# Patient Record
Sex: Female | Born: 1965 | Race: White | Hispanic: No | Marital: Married | Smoking: Never smoker
Health system: Southern US, Community
[De-identification: ages and names within clinical notes are randomized; demographics above are authoritative.]

---

## 2006-02-12 ENCOUNTER — Ambulatory Visit: Payer: Self-pay | Admitting: Family Medicine

## 2011-02-13 ENCOUNTER — Ambulatory Visit: Payer: Self-pay | Admitting: Family Medicine

## 2013-12-09 ENCOUNTER — Ambulatory Visit: Payer: Self-pay | Admitting: Family Medicine

## 2016-07-28 ENCOUNTER — Other Ambulatory Visit: Payer: Self-pay | Admitting: Family Medicine

## 2016-07-28 DIAGNOSIS — Z1231 Encounter for screening mammogram for malignant neoplasm of breast: Secondary | ICD-10-CM

## 2016-08-07 ENCOUNTER — Ambulatory Visit
Admission: RE | Admit: 2016-08-07 | Discharge: 2016-08-07 | Disposition: A | Discharge status: Home / Self Care | Payer: 59 | Source: Ambulatory Visit | Attending: Family Medicine | Admitting: Family Medicine

## 2016-08-07 ENCOUNTER — Encounter: Payer: Self-pay | Admitting: Radiology

## 2016-08-07 DIAGNOSIS — Z1231 Encounter for screening mammogram for malignant neoplasm of breast: Secondary | ICD-10-CM | POA: Insufficient documentation

## 2019-03-30 ENCOUNTER — Other Ambulatory Visit: Payer: Self-pay | Admitting: Family Medicine

## 2019-03-30 DIAGNOSIS — Z1231 Encounter for screening mammogram for malignant neoplasm of breast: Secondary | ICD-10-CM

## 2019-06-22 ENCOUNTER — Ambulatory Visit
Admission: RE | Admit: 2019-06-22 | Discharge: 2019-06-22 | Disposition: A | Discharge status: Home / Self Care | Payer: BC Managed Care – PPO | Source: Ambulatory Visit | Attending: Family Medicine | Admitting: Family Medicine

## 2019-06-22 ENCOUNTER — Other Ambulatory Visit: Payer: Self-pay

## 2019-06-22 DIAGNOSIS — Z1231 Encounter for screening mammogram for malignant neoplasm of breast: Secondary | ICD-10-CM | POA: Diagnosis not present

## 2021-05-28 IMAGING — MG DIGITAL SCREENING BILAT W/ TOMO W/ CAD
6 of 10 series · 6 of 30 positions shown · non-contrast
Comparison: Previous exam(s).

CLINICAL DATA: Screening.

EXAM:
DIGITAL SCREENING BILATERAL MAMMOGRAM WITH TOMO AND CAD

[L CC synth-2D]
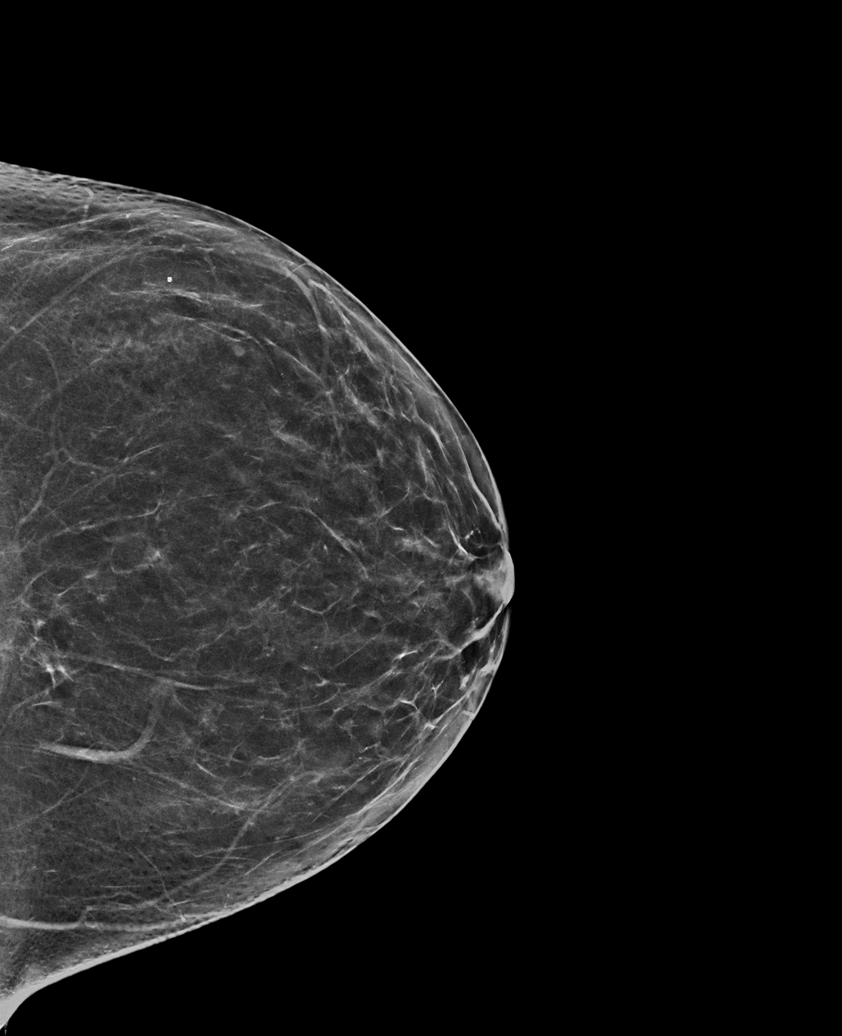

[L MLO synth-2D (1 of 2)]
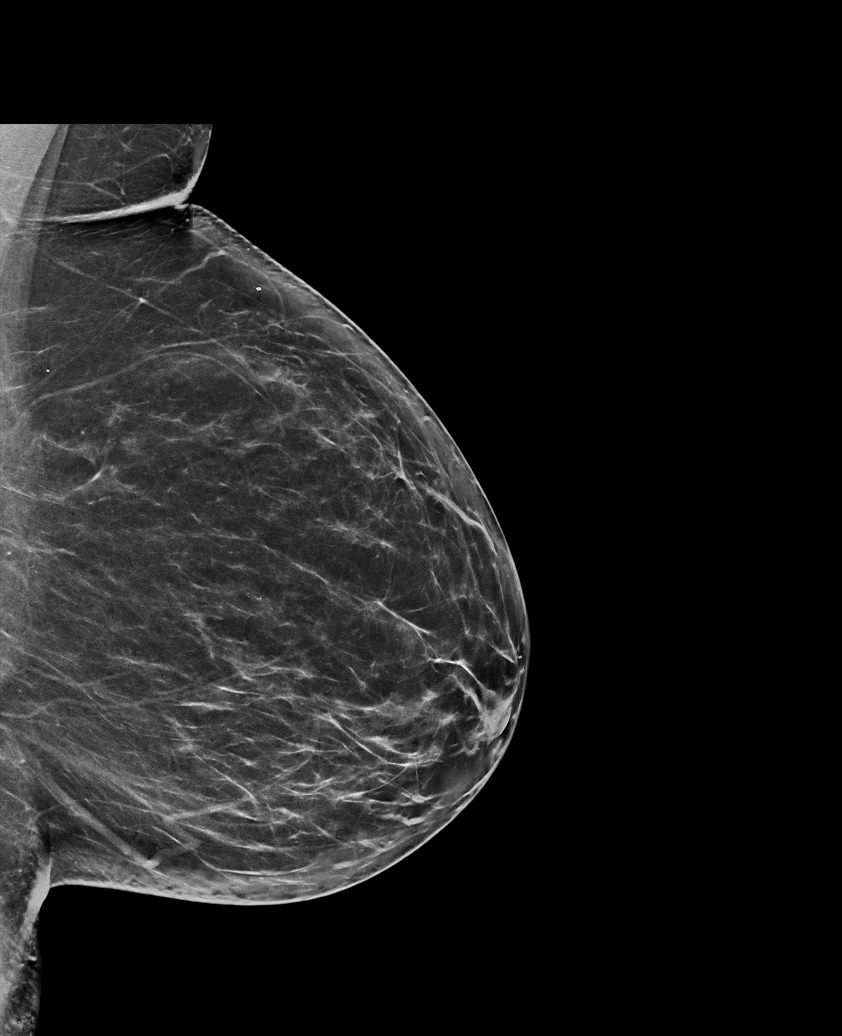

[R MLO synth-2D]
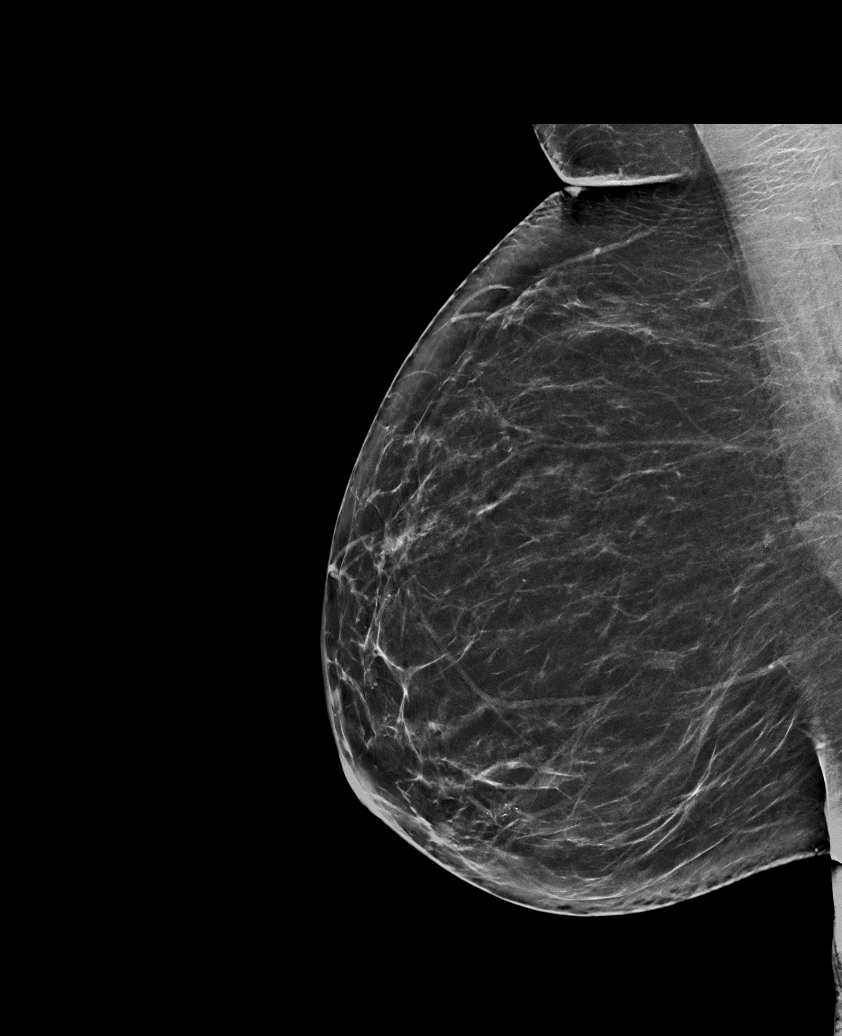

[L MLO synth-2D (2 of 2)]
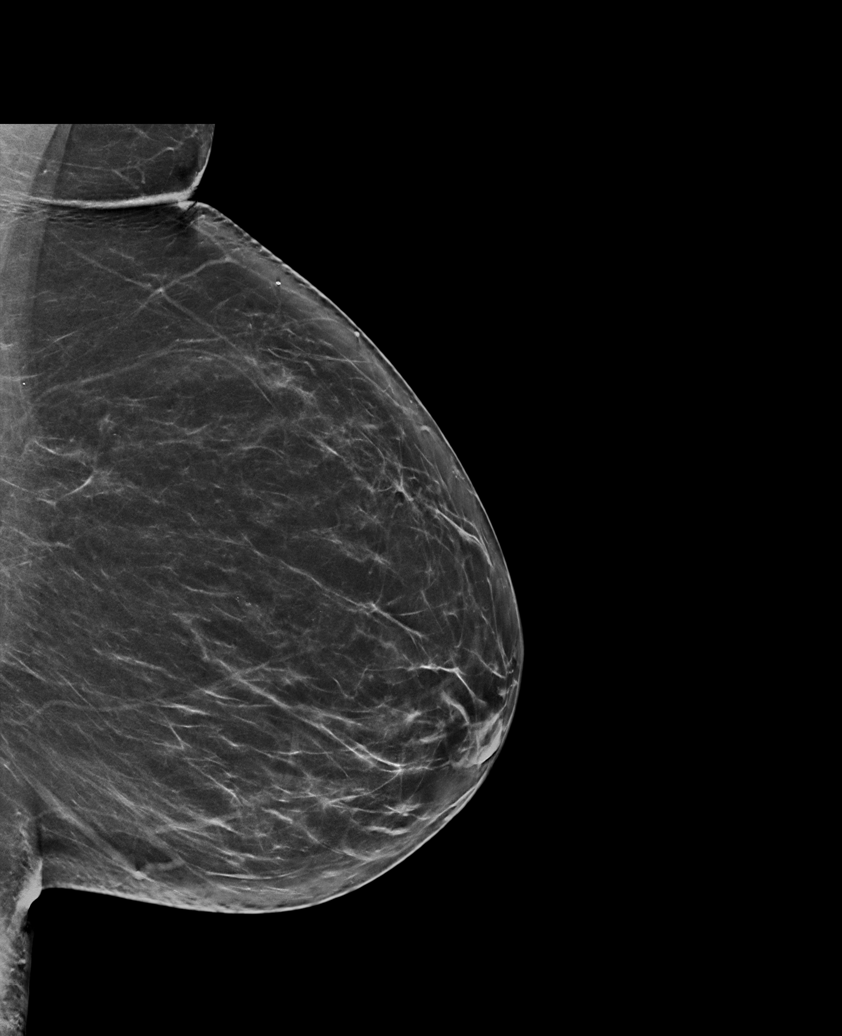

[R CC synth-2D]
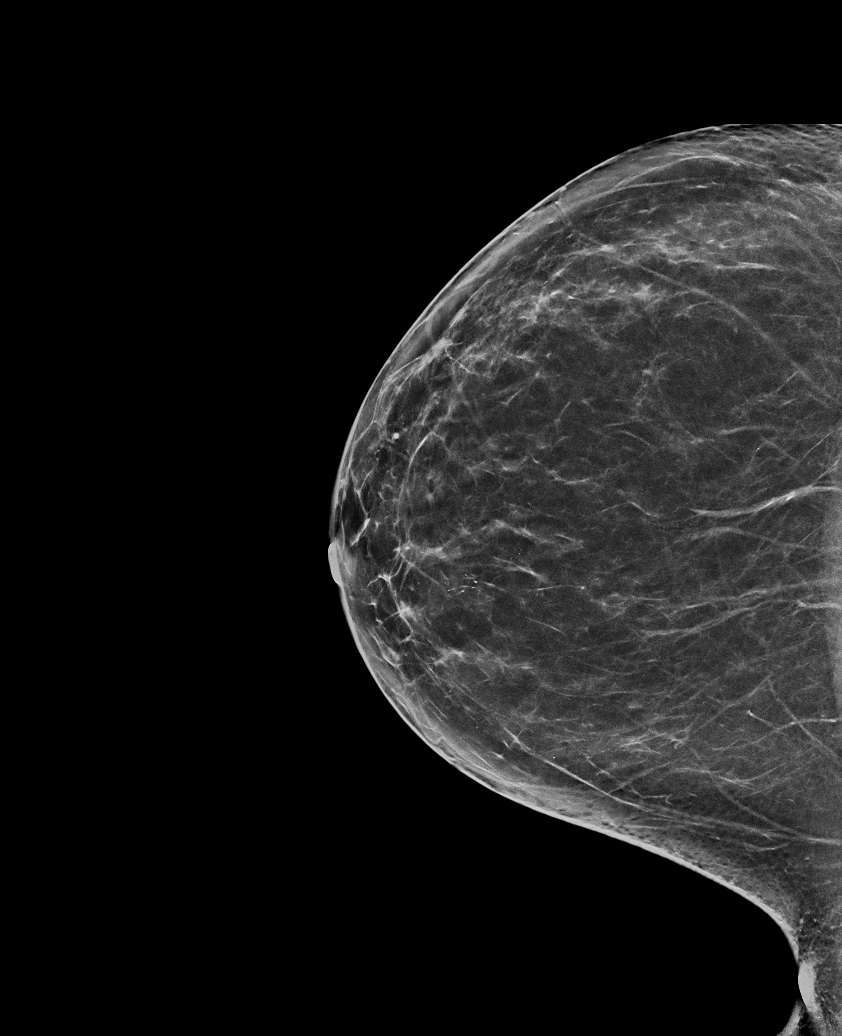

[L MLO tomo · tomo slice 38/75.0]
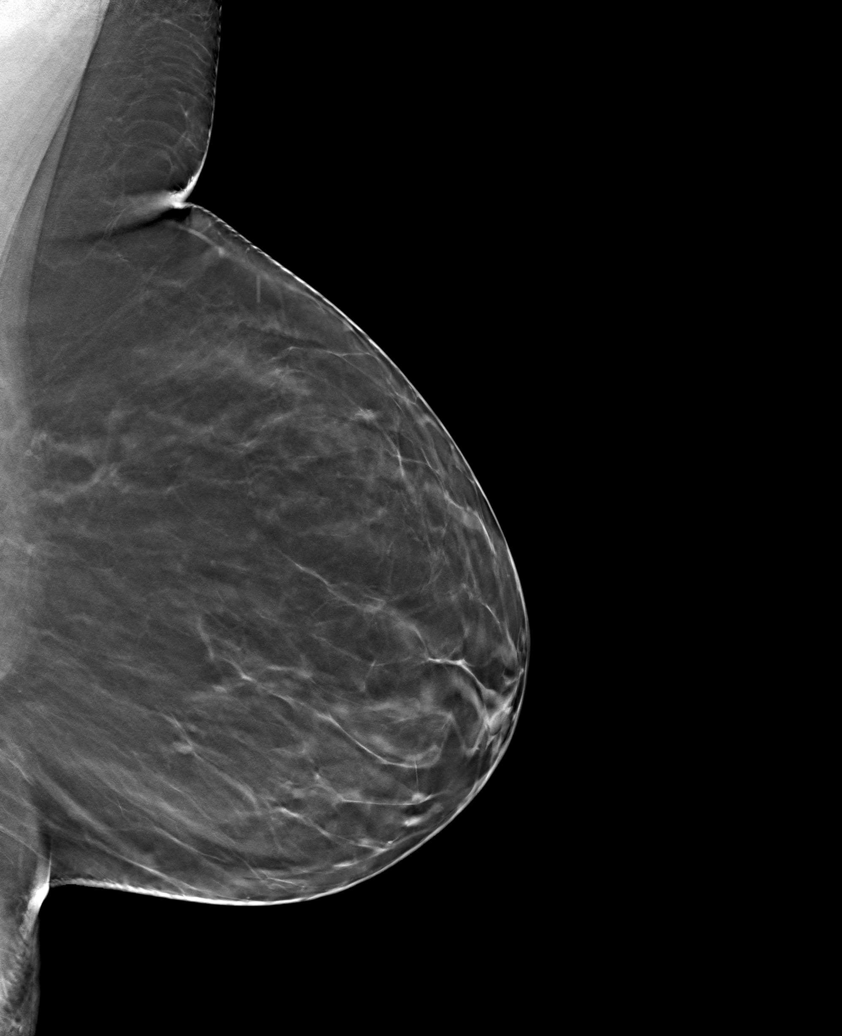

[6 of 30 positions shown; findings below may reference images not displayed]

ACR Breast Density Category b: There are scattered areas of
fibroglandular density.
FINDINGS: There are no findings suspicious for malignancy. Images were
processed with CAD.
IMPRESSION: No mammographic evidence of malignancy. A result letter of this
screening mammogram will be mailed directly to the patient.

RECOMMENDATION:
Screening mammogram in one year. (Code:[id number])

BI-RADS CATEGORY  1: Negative.

## 2021-08-15 ENCOUNTER — Other Ambulatory Visit: Payer: Self-pay | Admitting: Family Medicine

## 2021-08-29 ENCOUNTER — Ambulatory Visit
Admission: RE | Admit: 2021-08-29 | Discharge: 2021-08-29 | Disposition: A | Discharge status: Home / Self Care | Payer: BC Managed Care – PPO | Source: Ambulatory Visit | Attending: Family Medicine | Admitting: Family Medicine

## 2021-08-29 DIAGNOSIS — Z1231 Encounter for screening mammogram for malignant neoplasm of breast: Secondary | ICD-10-CM | POA: Diagnosis not present

## 2022-05-25 ENCOUNTER — Ambulatory Visit: Payer: BC Managed Care – PPO

## 2022-10-01 ENCOUNTER — Ambulatory Visit
Admission: EM | Admit: 2022-10-01 | Discharge: 2022-10-01 | Disposition: A | Discharge status: Home / Self Care | Payer: BC Managed Care – PPO | Attending: Physician Assistant | Admitting: Physician Assistant

## 2022-10-01 DIAGNOSIS — R051 Acute cough: Secondary | ICD-10-CM

## 2022-10-01 DIAGNOSIS — J029 Acute pharyngitis, unspecified: Secondary | ICD-10-CM

## 2022-10-01 DIAGNOSIS — J45901 Unspecified asthma with (acute) exacerbation: Secondary | ICD-10-CM

## 2022-10-01 LAB — GROUP A STREP BY PCR: Group A Strep by PCR: NOT DETECTED

## 2022-10-01 MED ORDER — PREDNISONE 10 MG PO TABS: ORAL_TABLET | ORAL | 0 refills | Status: AC

## 2022-10-01 NOTE — ED Provider Notes (Signed)
[facility]    CSN: [id number] Arrival date & time: 10/01/22  1135      History   Chief Complaint Chief Complaint  Patient presents with   Cough   Sore Throat    HPI Kaitlin Dunn is a 57 y.o. female presenting for approximately 2-day history of fatigue, feeling feverish with sore throat, cough, congestion, wheezing and shortness of breath.  Denies sinus pain, chest pain, vomiting or diarrhea.  Reports that she has been around a sick family member but unsure if he had COVID, flu or strep.  Has taken a COVID test at home and says it was negative.  Patient reports that she has been taking Advair daily and also reports having to use albuterol multiple times a day.  She says she is afraid that she started to have an exacerbation of her asthma and reports that she always tries to have a corticosteroid before it gets too bad.  She says that usually happens about once a year.  HPI  History reviewed. No pertinent past medical history.  There are no problems to display for this patient.   History reviewed. No pertinent surgical history.  OB History   No obstetric history on file.      Home Medications    Prior to Admission medications   Medication Sig Start Date End Date Taking? Authorizing Provider  albuterol (VENTOLIN HFA) 108 (90 Base) MCG/ACT inhaler Inhale into the lungs.   Yes [provider]  Cholecalciferol (VITAMIN D-1000 MAX ST) 25 MCG (1000 UT) tablet Take by mouth. 07/11/19  Yes [provider]  FLUoxetine (PROZAC) 20 MG capsule TAKE 1 TO 2 CAPSULES BY MOUTH EVERY DAY 02/22/18  Yes [provider]  fluticasone-salmeterol (ADVAIR) 250-50 MCG/ACT AEPB INHALE 1 INHALATION INTO THE LUNGS EVERY 12 (TWELVE) HOURS 03/21/19  Yes [provider]  losartan (COZAAR) 25 MG tablet Take 1 tablet by mouth daily. 08/11/22 08/11/23 Yes [provider]  montelukast (SINGULAIR) 10 MG tablet Take 1 tablet by mouth at bedtime. 04/04/20   Yes [provider]  predniSONE (DELTASONE) 10 MG tablet Take 6 tabs po on day 1 and decrease by 1 tab daily until complete 10/01/22  Yes Shirlee Latch, PA-C    Family History Family History  Problem Relation Age of Onset   Breast cancer Neg Hx     Social History Social History   Tobacco Use   Smoking status: Never   Smokeless tobacco: Never  Vaping Use   Vaping Use: Never used  Substance Use Topics   Alcohol use: Yes     Allergies   Sulfa antibiotics   Review of Systems Review of Systems  Constitutional:  Positive for fatigue. Negative for chills, diaphoresis and fever.  HENT:  Positive for congestion, rhinorrhea and sore throat. Negative for ear pain, sinus pressure and sinus pain.   Respiratory:  Positive for cough, chest tightness, shortness of breath and wheezing.   Cardiovascular:  Negative for chest pain.  Gastrointestinal:  Negative for abdominal pain, nausea and vomiting.  Musculoskeletal:  Negative for arthralgias and myalgias.  Skin:  Negative for rash.  Neurological:  Negative for weakness and headaches.  Hematological:  Negative for adenopathy.     Physical Exam Triage Vital Signs ED Triage Vitals  Enc Vitals Group     BP      Pulse      Resp      Temp      Temp src  SpO2      Weight      Height      Head Circumference      Peak Flow      Pain Score      Pain Loc      Pain Edu?      Excl. in GC?    No data found.  Updated Vital Signs BP (!) 159/95   Pulse 78   Temp 98.8 F (37.1 C) (Oral)   SpO2 97%      Physical Exam Vitals and nursing note reviewed.  Constitutional:      General: She is not in acute distress.    Appearance: Normal appearance. She is not ill-appearing or toxic-appearing.  HENT:     Head: Normocephalic and atraumatic.     Nose: Congestion present.     Mouth/Throat:     Mouth: Mucous membranes are moist.     Pharynx: Oropharynx is clear. Posterior oropharyngeal erythema present.  Eyes:      General: No scleral icterus.       Right eye: No discharge.        Left eye: No discharge.     Conjunctiva/sclera: Conjunctivae normal.  Cardiovascular:     Rate and Rhythm: Normal rate and regular rhythm.     Heart sounds: Normal heart sounds.  Pulmonary:     Effort: Pulmonary effort is normal. No respiratory distress.     Breath sounds: Wheezing present.  Musculoskeletal:     Cervical back: Neck supple.  Skin:    General: Skin is dry.  Neurological:     General: No focal deficit present.     Mental Status: She is alert. Mental status is at baseline.     Motor: No weakness.     Gait: Gait normal.  Psychiatric:        Mood and Affect: Mood normal.        Behavior: Behavior normal.        Thought Content: Thought content normal.      UC Treatments / Results  Labs (all labs ordered are listed, but only abnormal results are displayed) Labs Reviewed  GROUP A STREP BY PCR    EKG   Radiology No results found.  Procedures Procedures (including critical care time)  Medications Ordered in UC Medications - No data to display  Initial Impression / Assessment and Plan / UC Course  I have reviewed the triage vital signs and the nursing notes.  Pertinent labs & imaging results that were available during my care of the patient were reviewed by me and considered in my medical decision making (see chart for details).   57 year old female presents for 2-day history of fatigue, cough, congestion, sore throat, chest tightness and shortness of breath. Negative COVID test at home.  Vitals are stable but blood pressure is a bit elevated.  She is afebrile.  Overall well-appearing.  No acute distress.  Mild nasal congestion and erythema posterior pharynx.  Few scattered wheezes and overall mildly reduced breath sounds.  Patient strep test performed.  Negative.  Reviewed with the patient.  Viral illness.  Mild asthma exacerbation.  Sent prednisone for asthma exacerbation.  Advised  decongestants, rest, fluids, use of inhalers.  Reviewed return precautions.   Final Clinical Impressions(s) / UC Diagnoses   Final diagnoses:  Asthma with acute exacerbation, unspecified asthma severity, unspecified whether persistent  Acute cough  Sore throat     Discharge Instructions      URI/COLD SYMPTOMS: Your  exam today is consistent with a viral illness. Antibiotics are not indicated at this time. Use medications as directed, including cough syrup, nasal saline, and decongestants. Your symptoms should improve over the next few days and resolve within 7-10 days. Increase rest and fluids. F/u if symptoms worsen or predominate such as sore throat, ear pain, productive cough, shortness of breath, or if you develop high fevers or worsening fatigue over the next several days.    -Continue to use inhalers. Start prednisone. Start Mucinex. F/u if symptoms worsen.     ED Prescriptions     Medication Sig Dispense Auth. Provider   predniSONE (DELTASONE) 10 MG tablet Take 6 tabs po on day 1 and decrease by 1 tab daily until complete 21 tablet Shirlee Latch, PA-C      PDMP not reviewed this encounter.   Shirlee Latch, PA-C 10/01/22 1316

## 2022-10-01 NOTE — ED Triage Notes (Addendum)
Pt c/o sore throat, cough, pt states she is concerned for asthma flare up onset x2 days ago. Pt states it does hurt to swallow, swollen glands. Pt also reports she did take a home covid test (negative result)

## 2022-10-01 NOTE — Discharge Instructions (Addendum)
URI/COLD SYMPTOMS: Your exam today is consistent with a viral illness. Antibiotics are not indicated at this time. Use medications as directed, including cough syrup, nasal saline, and decongestants. Your symptoms should improve over the next few days and resolve within 7-10 days. Increase rest and fluids. F/u if symptoms worsen or predominate such as sore throat, ear pain, productive cough, shortness of breath, or if you develop high fevers or worsening fatigue over the next several days.    -Continue to use inhalers. Start prednisone. Start Mucinex. F/u if symptoms worsen.

## 2022-10-07 ENCOUNTER — Other Ambulatory Visit: Payer: Self-pay | Admitting: Family Medicine

## 2022-10-07 DIAGNOSIS — Z1231 Encounter for screening mammogram for malignant neoplasm of breast: Secondary | ICD-10-CM

## 2022-12-02 ENCOUNTER — Ambulatory Visit
Admission: RE | Admit: 2022-12-02 | Discharge: 2022-12-02 | Disposition: A | Discharge status: Home / Self Care | Payer: BC Managed Care – PPO | Source: Ambulatory Visit | Attending: Family Medicine | Admitting: Family Medicine

## 2022-12-02 DIAGNOSIS — Z1231 Encounter for screening mammogram for malignant neoplasm of breast: Secondary | ICD-10-CM | POA: Diagnosis present
